# Patient Record
Sex: Male | Born: 1990 | Race: Black or African American | Hispanic: No | Marital: Single | Smoking: Former smoker
Health system: Southern US, Community
[De-identification: ages and names within clinical notes are randomized; demographics above are authoritative.]

## PROBLEM LIST (undated history)

## (undated) DIAGNOSIS — G8929 Other chronic pain: Secondary | ICD-10-CM

---

## 2008-02-28 ENCOUNTER — Emergency Department: Payer: Self-pay | Admitting: Emergency Medicine

## 2010-04-16 ENCOUNTER — Emergency Department: Payer: Self-pay | Admitting: Emergency Medicine

## 2013-02-12 ENCOUNTER — Emergency Department: Payer: Self-pay | Admitting: Emergency Medicine

## 2013-02-12 LAB — COMPREHENSIVE METABOLIC PANEL
Albumin: 4.6 g/dL (ref 3.4–5.0)
Alkaline Phosphatase: 73 U/L (ref 50–136)
Anion Gap: 4 — ABNORMAL LOW (ref 7–16)
BUN: 9 mg/dL (ref 7–18)
Bilirubin,Total: 0.9 mg/dL (ref 0.2–1.0)
Calcium, Total: 9.2 mg/dL (ref 8.5–10.1)
Co2: 27 mmol/L (ref 21–32)
Creatinine: 0.91 mg/dL (ref 0.60–1.30)
EGFR (Non-African Amer.): >60
Potassium: 3.7 mmol/L (ref 3.5–5.1)
SGPT (ALT): 16 U/L (ref 12–78)
Sodium: 137 mmol/L (ref 136–145)

## 2013-02-12 LAB — URINALYSIS, COMPLETE
Bilirubin,UR: NEGATIVE
Blood: NEGATIVE
Glucose,UR: NEGATIVE mg/dL (ref 0–75)
Leukocyte Esterase: NEGATIVE
Nitrite: NEGATIVE
Protein: NEGATIVE
RBC,UR: 1 /HPF (ref 0–5)
Specific Gravity: 1.017 (ref 1.003–1.030)
Squamous Epithelial: NONE SEEN
WBC UR: <1 /HPF (ref 0–5)

## 2013-02-12 LAB — CBC
HCT: 49.1 % (ref 40.0–52.0)
HGB: 15.8 g/dL (ref 13.0–18.0)
MCH: 28.7 pg (ref 26.0–34.0)
MCHC: 32.1 g/dL (ref 32.0–36.0)
MCV: 89 fL (ref 80–100)
Platelet: 252 10*3/uL (ref 150–440)
RBC: 5.5 10*6/uL (ref 4.40–5.90)
RDW: 13.4 % (ref 11.5–14.5)
WBC: 6.5 10*3/uL (ref 3.8–10.6)

## 2013-02-12 LAB — LIPASE, BLOOD: Lipase: 48 U/L — ABNORMAL LOW (ref 73–393)

## 2014-07-23 ENCOUNTER — Emergency Department: Payer: Self-pay | Admitting: Emergency Medicine

## 2014-08-20 ENCOUNTER — Emergency Department: Payer: Self-pay | Admitting: Emergency Medicine

## 2015-04-22 ENCOUNTER — Emergency Department
Admission: EM | Admit: 2015-04-22 | Discharge: 2015-04-22 | Disposition: A | Discharge status: Home / Self Care | Payer: Self-pay | Attending: Emergency Medicine | Admitting: Emergency Medicine

## 2015-04-22 ENCOUNTER — Encounter: Payer: Self-pay | Admitting: Emergency Medicine

## 2015-04-22 DIAGNOSIS — M791 Myalgia: Secondary | ICD-10-CM | POA: Insufficient documentation

## 2015-04-22 DIAGNOSIS — J029 Acute pharyngitis, unspecified: Secondary | ICD-10-CM | POA: Insufficient documentation

## 2015-04-22 LAB — POCT RAPID STREP A: Streptococcus, Group A Screen (Direct): NEGATIVE

## 2015-04-22 MED ORDER — LIDOCAINE VISCOUS 2 % MT SOLN
15.0000 mL | Freq: Once | OROMUCOSAL | Status: AC
  Administered 2015-04-22: 15 mL via OROMUCOSAL

## 2015-04-22 MED ORDER — LIDOCAINE VISCOUS 2 % MT SOLN: 20.0000 mL | OROMUCOSAL | Status: DC | PRN

## 2015-04-22 MED ORDER — LIDOCAINE VISCOUS 2 % MT SOLN
OROMUCOSAL | Status: AC
  Filled 2015-04-22: qty 15

## 2015-04-22 MED ORDER — AMOXICILLIN 500 MG PO CAPS: 500.0000 mg | ORAL_CAPSULE | Freq: Three times a day (TID) | ORAL | Status: AC

## 2015-04-22 NOTE — ED Notes (Signed)
Sore throat, chills for 2 days.

## 2015-04-22 NOTE — ED Provider Notes (Signed)
CSN: [id number]     Arrival date & time 04/22/15  1154 History   First MD Initiated Contact with Patient 04/22/15 1226     Chief Complaint  Patient presents with  . Sore Throat      HPI Comments: 24 year old male presents today complaining of sore throat for the past 2 days. He reports he has had fever and chills at home. He has not had any sinus congestion or cough. He has been using over-the-counter medicines without relief.  Patient is a 24 y.o. male presenting with pharyngitis. The history is provided by the patient.  Sore Throat This is a new problem. Episode onset:  2 days  The problem occurs constantly. The problem has been unchanged. Associated symptoms include chills, a fever, myalgias and a sore throat. Pertinent negatives include no neck pain or rash. The symptoms are aggravated by eating and drinking. He has tried acetaminophen for the symptoms. The treatment provided no relief.    History reviewed. No pertinent past medical history. History reviewed. No pertinent past surgical history. No family history on file. History  Substance Use Topics  . Smoking status: Not on file  . Smokeless tobacco: Not on file  . Alcohol Use: Not on file    Review of Systems  Constitutional: Positive for fever and chills.  HENT: Positive for sore throat.   Musculoskeletal: Positive for myalgias. Negative for neck pain.  Skin: Negative for rash.  All other systems reviewed and are negative.     Allergies  Review of patient's allergies indicates no known allergies.  Home Medications   Prior to Admission medications   Medication Sig Start Date End Date Taking? Authorizing Provider  amoxicillin (AMOXIL) 500 MG capsule Take 1 capsule (500 mg total) by mouth 3 (three) times daily. 04/22/15 05/02/15  Wilber Oliphant V, PA-C  lidocaine (XYLOCAINE) 2 % solution Use as directed 20 mLs in the mouth or throat as needed for mouth pain. 04/22/15   Wilber Oliphant V, PA-C   BP 118/79 mmHg  Pulse 72   Temp(Src) 100 F (37.8 C) (Oral)  Resp 18  Ht  (1.702 m)  Wt 140 lb (63.504 kg)  BMI 21.92 kg/m2  SpO2 98% Physical Exam  Constitutional: He is oriented to person, place, and time. He appears well-developed and well-nourished.  Non-toxic appearance. He does not have a sickly appearance. He appears ill.  Patient is febrile  HENT:  Head: Normocephalic and atraumatic.  Right Ear: External ear normal.  Left Ear: External ear normal.  Nose: Nose normal.  Mouth/Throat: Uvula is midline and mucous membranes are normal. Posterior oropharyngeal edema and posterior oropharyngeal erythema present. No oropharyngeal exudate.  Bilateral cerumen impaction  Eyes: Conjunctivae are normal.  Neck: Normal range of motion. Neck supple.  Cardiovascular: Normal rate, regular rhythm and normal heart sounds.  Exam reveals no gallop and no friction rub.   No murmur heard. Pulmonary/Chest: Effort normal. He has wheezes.  Musculoskeletal: Normal range of motion.  Lymphadenopathy:    He has cervical adenopathy.  Neurological: He is alert and oriented to person, place, and time.  Skin: Skin is warm and dry.  Psychiatric: He has a normal mood and affect. His behavior is normal. Judgment and thought content normal.  Nursing note and vitals reviewed.   ED Course  Procedures (including critical care time) Labs Review Labs Reviewed  CULTURE, BETA STREP (GROUP B ONLY)  CULTURE, GROUP A STREP ([facility] ONLY)  POCT RAPID STREP A    Imaging Review  No results found.   EKG Interpretation None      MDM  Amoxicillin course. Viscous lidocaine home. Alternate Tylenol and Motrin as needed. Return for new or worsening symptoms. Final diagnoses:  Pharyngitis        Luvenia Reddenmma Weavil V, PA-C 04/22/15 1409  Minna AntisKevin Paduchowski, MD 04/22/15 1440

## 2015-04-25 LAB — CULTURE, GROUP A STREP (THRC)

## 2016-11-06 ENCOUNTER — Encounter: Payer: Self-pay | Admitting: *Deleted

## 2016-11-06 ENCOUNTER — Emergency Department
Admission: EM | Admit: 2016-11-06 | Discharge: 2016-11-07 | Disposition: A | Discharge status: Home / Self Care | Payer: Self-pay | Attending: Emergency Medicine | Admitting: Emergency Medicine

## 2016-11-06 DIAGNOSIS — J111 Influenza due to unidentified influenza virus with other respiratory manifestations: Secondary | ICD-10-CM

## 2016-11-06 DIAGNOSIS — B349 Viral infection, unspecified: Secondary | ICD-10-CM | POA: Insufficient documentation

## 2016-11-06 DIAGNOSIS — R69 Illness, unspecified: Secondary | ICD-10-CM

## 2016-11-06 DIAGNOSIS — Z79899 Other long term (current) drug therapy: Secondary | ICD-10-CM | POA: Insufficient documentation

## 2016-11-06 DIAGNOSIS — F17200 Nicotine dependence, unspecified, uncomplicated: Secondary | ICD-10-CM | POA: Insufficient documentation

## 2016-11-06 MED ORDER — PSEUDOEPH-BROMPHEN-DM 30-2-10 MG/5ML PO SYRP: 5.0000 mL | ORAL_SOLUTION | Freq: Four times a day (QID) | ORAL | 0 refills | Status: DC | PRN

## 2016-11-06 MED ORDER — IBUPROFEN 600 MG PO TABS: 600.0000 mg | ORAL_TABLET | Freq: Three times a day (TID) | ORAL | 0 refills | Status: DC | PRN

## 2016-11-06 MED ORDER — KETOROLAC TROMETHAMINE 60 MG/2ML IM SOLN
60.0000 mg | Freq: Once | INTRAMUSCULAR | Status: AC
  Administered 2016-11-06: 60 mg via INTRAMUSCULAR
  Filled 2016-11-06: qty 2

## 2016-11-06 MED ORDER — BENZONATATE 100 MG PO CAPS
200.0000 mg | ORAL_CAPSULE | Freq: Once | ORAL | Status: AC
  Administered 2016-11-06: 200 mg via ORAL
  Filled 2016-11-06: qty 2

## 2016-11-06 NOTE — ED Triage Notes (Signed)
Patient c/o general body aches and chlls for 2-3 days.

## 2016-11-06 NOTE — ED Provider Notes (Signed)
[facility] Emergency Department Provider Note   ____________________________________________   First MD Initiated Contact with Patient 11/06/16 2137     (approximate)  I have reviewed the triage vital signs and the nursing notes.   HISTORY  Chief Complaint Generalized Body Aches    HPI Jearld ShinesMarquis K Mcqueary is a 25 y.o. male patient complain body aches and chills for 2-3 days. Patient also complained of frontal headache, sore throat, and nonproductive cough. Patient denies vomiting or diarrhea but states has nausea. Patient rates his pain as 8/10.   History reviewed. No pertinent past medical history.  There are no active problems to display for this patient.   History reviewed. No pertinent surgical history.  Prior to Admission medications   Medication Sig Start Date End Date Taking? Authorizing Provider  brompheniramine-pseudoephedrine-DM 30-2-10 MG/5ML syrup Take 5 mLs by mouth 4 (four) times daily as needed. 11/06/16   Joni Reiningonald K Aysen Shieh, PA-C  ibuprofen (ADVIL,MOTRIN) 600 MG tablet Take 1 tablet (600 mg total) by mouth every 8 (eight) hours as needed. 11/06/16   Joni Reiningonald K Baylei Siebels, PA-C  lidocaine (XYLOCAINE) 2 % solution Use as directed 20 mLs in the mouth or throat as needed for mouth pain. 04/22/15   Christella ScheuermannEmma V Lawrence, PA-C    Allergies Patient has no known allergies.  No family history on file.  Social History Social History  Substance Use Topics  . Smoking status: Current Every Day Smoker  . Smokeless tobacco: Never Used  . Alcohol use No    Review of Systems Constitutional: Fever,body ache, and chills Eyes: No visual changes. ENT: No sore throat. Cardiovascular: Denies chest pain. Respiratory: Denies shortness of breath. Gastrointestinal: No abdominal pain.  No nausea, no vomiting.  No diarrhea.  No constipation. Genitourinary: Negative for dysuria. Musculoskeletal: Negative for back pain. Skin: Negative for rash. Neurological: Positive  for headaches, denies focal weakness or numbness.    ____________________________________________   PHYSICAL EXAM:  VITAL SIGNS: ED Triage Vitals  Enc Vitals Group     BP 11/06/16 2056 133/77     Pulse Rate 11/06/16 2056 98     Resp 11/06/16 2056 18     Temp 11/06/16 2056 99.6 F (37.6 C)     Temp Source 11/06/16 2056 Oral     SpO2 11/06/16 2056 98 %     Weight 11/06/16 2056 140 lb (63.5 kg)     Height 11/06/16 2056 5\' 7"  (1.702 m)     Head Circumference --      Peak Flow --      Pain Score 11/06/16 2057 8     Pain Loc --      Pain Edu? --      Excl. in GC? --     Constitutional: Alert and oriented. Well appearing and in no acute distress. Eyes: Conjunctivae are normal. PERRL. EOMI. Head: Atraumatic. Nose: Edematous nasal turbinates with clear rhinorrhea  Mouth/Throat: Mucous membranes are moist.  Oropharynx non-erythematous. Neck: No stridor.  No cervical spine tenderness to palpation. Hematological/Lymphatic/Immunilogical: No cervical lymphadenopathy. Cardiovascular: Normal rate, regular rhythm. Grossly normal heart sounds.  Good peripheral circulation. Respiratory: Normal respiratory effort.  No retractions. Lungs CTAB. Gastrointestinal: Soft and nontender. No distention. No abdominal bruits. No CVA tenderness. Musculoskeletal: No lower extremity tenderness nor edema.  No joint effusions. Neurologic:  Normal speech and language. No gross focal neurologic deficits are appreciated. No gait instability. Skin:  Skin is warm, dry and intact. No rash noted. Psychiatric: Mood and affect are normal.  Speech and behavior are normal.  ____________________________________________   LABS (all labs ordered are listed, but only abnormal results are displayed)  Labs Reviewed  RESPIRATORY PANEL BY PCR  INFLUENZA PANEL BY PCR (TYPE A & B, H1N1)    ____________________________________________  EKG   ____________________________________________  RADIOLOGY   ____________________________________________   PROCEDURES  Procedure(s) performed: None  Procedures  Critical Care performed: No  ____________________________________________   INITIAL IMPRESSION / ASSESSMENT AND PLAN / ED COURSE  Pertinent labs & imaging results that were available during my care of the patient were reviewed by me and considered in my medical decision making (see chart for details).  Viral illness. Patient given discharge care instructions. Patient get a prescription from [facility] and ibuprofen. Patient advised follow "clinic if condition persists.  Clinical Course      ____________________________________________   FINAL CLINICAL IMPRESSION(S) / ED DIAGNOSES  Final diagnoses:  Influenza-like illness      NEW MEDICATIONS STARTED DURING THIS VISIT:  New Prescriptions   BROMPHENIRAMINE-PSEUDOEPHEDRINE-DM 30-2-10 MG/5ML SYRUP    Take 5 mLs by mouth 4 (four) times daily as needed.   IBUPROFEN (ADVIL,MOTRIN) 600 MG TABLET    Take 1 tablet (600 mg total) by mouth every 8 (eight) hours as needed.     Note:  This document was prepared using Dragon voice recognition software and may include unintentional dictation errors.    Joni Reiningonald K Mace Weinberg, PA-C 11/06/16 2347    Sharman CheekPhillip Stafford, MD 11/07/16 [phone]

## 2016-11-07 LAB — RESPIRATORY PANEL BY PCR
ADENOVIRUS-RVPPCR: NOT DETECTED
Bordetella pertussis: NOT DETECTED
CORONAVIRUS 229E-RVPPCR: NOT DETECTED
CORONAVIRUS NL63-RVPPCR: NOT DETECTED
Chlamydophila pneumoniae: NOT DETECTED
Coronavirus HKU1: NOT DETECTED
Coronavirus OC43: NOT DETECTED
Influenza A H3: DETECTED — AB
Influenza B: NOT DETECTED
METAPNEUMOVIRUS-RVPPCR: NOT DETECTED
MYCOPLASMA PNEUMONIAE-RVPPCR: NOT DETECTED
PARAINFLUENZA VIRUS 4-RVPPCR: NOT DETECTED
Parainfluenza Virus 1: NOT DETECTED
Parainfluenza Virus 2: NOT DETECTED
Parainfluenza Virus 3: NOT DETECTED
RESPIRATORY SYNCYTIAL VIRUS-RVPPCR: NOT DETECTED
Rhinovirus / Enterovirus: NOT DETECTED

## 2016-11-09 ENCOUNTER — Telehealth: Payer: Self-pay | Admitting: Emergency Medicine

## 2016-11-09 NOTE — Telephone Encounter (Signed)
Called patient due to lwot to inquire about condition and follow up plans. Number is not working number.

## 2018-02-27 ENCOUNTER — Encounter: Payer: Self-pay | Admitting: Emergency Medicine

## 2018-02-27 ENCOUNTER — Other Ambulatory Visit: Payer: Self-pay

## 2018-02-27 ENCOUNTER — Emergency Department
Admission: EM | Admit: 2018-02-27 | Discharge: 2018-02-27 | Disposition: A | Discharge status: Home / Self Care | Payer: Self-pay | Attending: Emergency Medicine | Admitting: Emergency Medicine

## 2018-02-27 DIAGNOSIS — J111 Influenza due to unidentified influenza virus with other respiratory manifestations: Secondary | ICD-10-CM | POA: Insufficient documentation

## 2018-02-27 DIAGNOSIS — R69 Illness, unspecified: Secondary | ICD-10-CM

## 2018-02-27 DIAGNOSIS — F17210 Nicotine dependence, cigarettes, uncomplicated: Secondary | ICD-10-CM | POA: Insufficient documentation

## 2018-02-27 MED ORDER — PSEUDOEPH-BROMPHEN-DM 30-2-10 MG/5ML PO SYRP: 5.0000 mL | ORAL_SOLUTION | Freq: Four times a day (QID) | ORAL | 0 refills | Status: DC | PRN

## 2018-02-27 MED ORDER — KETOROLAC TROMETHAMINE 60 MG/2ML IM SOLN
60.0000 mg | Freq: Once | INTRAMUSCULAR | Status: AC
  Administered 2018-02-27: 60 mg via INTRAMUSCULAR
  Filled 2018-02-27: qty 2

## 2018-02-27 MED ORDER — IBUPROFEN 600 MG PO TABS: 600.0000 mg | ORAL_TABLET | Freq: Three times a day (TID) | ORAL | 0 refills | Status: DC | PRN

## 2018-02-27 NOTE — ED Provider Notes (Signed)
[facility] Emergency Department Provider Note   ____________________________________________   First MD Initiated Contact with Patient 02/27/18 1252     (approximate)  I have reviewed the triage vital signs and the nursing notes.   HISTORY  Chief Complaint Generalized Body Aches    HPI Raymond Hendricks is a 27 y.o. male patient complained of onset of generalized body aches, scratchy throat and nasal congestion which started yesterday.  Patient denies nausea, vomiting, diarrhea, patient had taken flu shot for this season.  Patient states took allergy pill but found no relief with the nasal congestion.  Patient rates his pain as a 10/10.  Patient described the pain is "aching".  History reviewed. No pertinent past medical history.  There are no active problems to display for this patient.   History reviewed. No pertinent surgical history.  Prior to Admission medications   Medication Sig Start Date End Date Taking? Authorizing Provider  brompheniramine-pseudoephedrine-DM 30-2-10 MG/5ML syrup Take 5 mLs by mouth 4 (four) times daily as needed. 11/06/16   Joni Reining, PA-C  brompheniramine-pseudoephedrine-DM 30-2-10 MG/5ML syrup Take 5 mLs by mouth 4 (four) times daily as needed. 02/27/18   Joni Reining, PA-C  ibuprofen (ADVIL,MOTRIN) 600 MG tablet Take 1 tablet (600 mg total) by mouth every 8 (eight) hours as needed. 11/06/16   Joni Reining, PA-C  ibuprofen (ADVIL,MOTRIN) 600 MG tablet Take 1 tablet (600 mg total) by mouth every 8 (eight) hours as needed. 02/27/18   Joni Reining, PA-C  lidocaine (XYLOCAINE) 2 % solution Use as directed 20 mLs in the mouth or throat as needed for mouth pain. 04/22/15   Christella Scheuermann, PA-C    Allergies Patient has no known allergies.  No family history on file.  Social History Social History   Tobacco Use  . Smoking status: Current Every Day Smoker    Packs/day: 0.25    Types: Cigarettes  . Smokeless  tobacco: Never Used  Substance Use Topics  . Alcohol use: No  . Drug use: Yes    Types: Marijuana    Comment: several times during the week    Review of Systems Constitutional: No fever/chills.  Body ache. Eyes: No visual changes. ENT: Nasal congestion and sore throat. Cardiovascular: Denies chest pain. Respiratory: Denies shortness of breath. Gastrointestinal: No abdominal pain.  No nausea, no vomiting.  No diarrhea.  No constipation. Genitourinary: Negative for dysuria. Musculoskeletal: Negative for back pain. Skin: Negative for rash. Neurological: Negative for headaches, focal weakness or numbness.   ____________________________________________   PHYSICAL EXAM:  VITAL SIGNS: ED Triage Vitals  Enc Vitals Group     BP 02/27/18 1236 135/85     Pulse Rate 02/27/18 1236 98     Resp 02/27/18 1236 18     Temp 02/27/18 1236 99.1 F (37.3 C)     Temp Source 02/27/18 1236 Oral     SpO2 02/27/18 1236 99 %     Weight 02/27/18 1234 140 lb (63.5 kg)     Height 02/27/18 1234 5\' 7"  (1.702 m)     Head Circumference --      Peak Flow --      Pain Score 02/27/18 1234 10     Pain Loc --      Pain Edu? --      Excl. in GC? --    Constitutional: Alert and oriented. Well appearing and in no acute distress. Nose: Edematous nasal turbinates with clear rhinorrhea.   Mouth/Throat:  Mucous membranes are moist.  Oropharynx non-erythematous. Neck: No stridor.  Hematological/Lymphatic/Immunilogical: No cervical lymphadenopathy. Cardiovascular: Normal rate, regular rhythm. Grossly normal heart sounds.  Good peripheral circulation. Respiratory: Normal respiratory effort.  No retractions. Lungs CTAB. Neurologic:  Normal speech and language. No gross focal neurologic deficits are appreciated. No gait instability. Skin:  Skin is warm, dry and intact. No rash noted. Psychiatric: Mood and affect are normal. Speech and behavior are normal.  ____________________________________________    LABS (all labs ordered are listed, but only abnormal results are displayed)  Labs Reviewed - No data to display ____________________________________________  EKG   ____________________________________________  RADIOLOGY  ED MD interpretation:    Official radiology report(s): No results found.  ____________________________________________   PROCEDURES  Procedure(s) performed: None  Procedures  Critical Care performed: No  ____________________________________________   INITIAL IMPRESSION / ASSESSMENT AND PLAN / ED COURSE  As part of my medical decision making, I reviewed the following data within the electronic MEDICAL RECORD NUMBER    Patient presents with generalized body aches, nasal congestion, and sore throat secondary to viral illness.  Patient given discharge care instruction and work note.  Patient advised take medication as directed.      ____________________________________________   FINAL CLINICAL IMPRESSION(S) / ED DIAGNOSES  Final diagnoses:  Influenza-like illness     ED Discharge Orders        Ordered    brompheniramine-pseudoephedrine-DM 30-2-10 MG/5ML syrup  4 times daily PRN     02/27/18 1302    ibuprofen (ADVIL,MOTRIN) 600 MG tablet  Every 8 hours PRN     02/27/18 1302       Note:  This document was prepared using Dragon voice recognition software and may include unintentional dictation errors.    Joni ReiningSmith, Arayah Krouse K, PA-C 02/27/18 1308    Schaevitz, Myra Rudeavid Matthew, MD 02/27/18 [phone]

## 2018-02-27 NOTE — ED Triage Notes (Signed)
Pt arrived via POV from home with reports generalized body aches, scratchy throat and nasal drainage.  Pt states sxs started yesterday while at work.  Pt is not taking any medication.  Pt states he thought it was allergies and took allergy pill but did not provide relief.

## 2018-02-27 NOTE — ED Notes (Signed)
Pt ambulatory to POV without difficulty. VSS. NAD. Discharge instructions, RX and follow up discussed. All questions answered.  

## 2018-12-17 ENCOUNTER — Emergency Department
Admission: EM | Admit: 2018-12-17 | Discharge: 2018-12-17 | Disposition: A | Discharge status: Home / Self Care | Payer: Self-pay | Attending: Student in an Organized Health Care Education/Training Program | Admitting: Student in an Organized Health Care Education/Training Program

## 2018-12-17 ENCOUNTER — Encounter: Payer: Self-pay | Admitting: Emergency Medicine

## 2018-12-17 ENCOUNTER — Other Ambulatory Visit: Payer: Self-pay

## 2018-12-17 ENCOUNTER — Emergency Department: Payer: Self-pay

## 2018-12-17 DIAGNOSIS — W230XXA Caught, crushed, jammed, or pinched between moving objects, initial encounter: Secondary | ICD-10-CM | POA: Insufficient documentation

## 2018-12-17 DIAGNOSIS — Y999 Unspecified external cause status: Secondary | ICD-10-CM | POA: Insufficient documentation

## 2018-12-17 DIAGNOSIS — F17210 Nicotine dependence, cigarettes, uncomplicated: Secondary | ICD-10-CM | POA: Insufficient documentation

## 2018-12-17 DIAGNOSIS — Y9389 Activity, other specified: Secondary | ICD-10-CM | POA: Insufficient documentation

## 2018-12-17 DIAGNOSIS — S63502A Unspecified sprain of left wrist, initial encounter: Secondary | ICD-10-CM | POA: Insufficient documentation

## 2018-12-17 DIAGNOSIS — Y929 Unspecified place or not applicable: Secondary | ICD-10-CM | POA: Insufficient documentation

## 2018-12-17 NOTE — Discharge Instructions (Addendum)
Follow-up with your regular doctor or Dr. Allena Katz if not better in 1 week.  Wear the wrist splint for several days.  Apply ice to the wrist.  Take over-the-counter Aleve.  Return if worsening.

## 2018-12-17 NOTE — ED Triage Notes (Signed)
C/O pain to left wrist.  States injured wrist yesterday.  States "he thinks while he was working"  States was holding a tool with left hand and hitting it with right.  STates vibrations hurt wrist.

## 2018-12-17 NOTE — ED Provider Notes (Signed)
[facility] Emergency Department Provider Note  ____________________________________________   First MD Initiated Contact with Patient 12/17/18 1945     (approximate)  I have reviewed the triage vital signs and the nursing notes.   HISTORY  Chief Complaint Wrist Injury    HPI DENNYS HAWKINS is a 28 y.o. male presents emergency department complaint of left wrist pain.  States he injured it yesterday.  He was slamming 2 pieces of metal together.  He denies having the arm caught in the metal just states that the left hand and kept hitting it with the right.  States the vibration kept hurting the wrist.  He denies any numbness or tingling.    History reviewed. No pertinent past medical history.  There are no active problems to display for this patient.   History reviewed. No pertinent surgical history.  Prior to Admission medications   Not on File    Allergies Patient has no known allergies.  No family history on file.  Social History Social History   Tobacco Use  . Smoking status: Current Every Day Smoker    Packs/day: 0.25    Types: Cigarettes  . Smokeless tobacco: Never Used  Substance Use Topics  . Alcohol use: No  . Drug use: Yes    Types: Marijuana    Comment: several times during the week    Review of Systems  Constitutional: No fever/chills Eyes: No visual changes. ENT: No sore throat. Respiratory: Denies cough Genitourinary: Negative for dysuria. Musculoskeletal: Negative for back pain.  Left wrist pain Skin: Negative for rash.    ____________________________________________   PHYSICAL EXAM:  VITAL SIGNS: ED Triage Vitals  Enc Vitals Group     BP 12/17/18 1846 117/71     Pulse Rate 12/17/18 1846 64     Resp 12/17/18 1846 20     Temp 12/17/18 1846 98.2 F (36.8 C)     Temp Source 12/17/18 1846 Oral     SpO2 12/17/18 1846 98 %     Weight 12/17/18 1847 148 lb (67.1 kg)     Height 12/17/18 1847 5\' 7"  (1.702 m)      Head Circumference --      Peak Flow --      Pain Score 12/17/18 1857 8     Pain Loc --      Pain Edu? --      Excl. in GC? --     Constitutional: Alert and oriented. Well appearing and in no acute distress. Eyes: Conjunctivae are normal.  Head: Atraumatic. Nose: No congestion/rhinnorhea. Mouth/Throat: Mucous membranes are moist.   Neck:  supple no lymphadenopathy noted Cardiovascular: Normal rate, regular rhythm. Respiratory: Normal respiratory effort.  No retractions, GU: deferred Musculoskeletal: FROM all extremities, warm and well perfused, the left wrist is tender to palpation, neurovascular is intact, full range of motion is noted. Neurologic:  Normal speech and language.  Skin:  Skin is warm, dry and intact. No rash noted. Psychiatric: Mood and affect are normal. Speech and behavior are normal.  ____________________________________________   LABS (all labs ordered are listed, but only abnormal results are displayed)  Labs Reviewed - No data to display ____________________________________________   ____________________________________________  RADIOLOGY  X-ray of the left wrist is negative  ____________________________________________   PROCEDURES  Procedure(s) performed: Velcro cock-up splint was applied by the tech  Procedures    ____________________________________________   INITIAL IMPRESSION / ASSESSMENT AND PLAN / ED COURSE  Pertinent labs & imaging results that were  available during my care of the patient were reviewed by me and considered in my medical decision making (see chart for details).   Patient is 28 year old male presents emergency department complaint of left wrist pain.  Physical exam shows the left wrist to be tender along the carpal bones and into the distal forearm.  X-ray of the left wrist is negative  X-ray results were explained to the patient.  He was placed in a Velcro cock-up splint.  He is to follow-up with  orthopedics if not better in 5 to 7 days.  Return emergency department worsening.  States he understands will comply.  Was discharged stable condition.  He was given a work note and instructed to take Aleve.     As part of my medical decision making, I reviewed the following data within the electronic MEDICAL RECORD NUMBER Nursing notes reviewed and incorporated, Old chart reviewed, Radiograph reviewed x-ray left wrist is negative, Notes from prior ED visits and [place] Controlled Substance Database  ____________________________________________   FINAL CLINICAL IMPRESSION(S) / ED DIAGNOSES  Final diagnoses:  Wrist sprain, left, initial encounter      NEW MEDICATIONS STARTED DURING THIS VISIT:  Current Discharge Medication List       Note:  This document was prepared using Dragon voice recognition software and may include unintentional dictation errors.    Faythe GheeFisher, Avari Gelles W, PA-C 12/17/18 2013    Willy Eddyobinson, Patrick, MD 12/17/18 [phone]

## 2019-04-15 ENCOUNTER — Other Ambulatory Visit: Payer: Self-pay

## 2019-04-15 ENCOUNTER — Encounter: Payer: Self-pay | Admitting: Emergency Medicine

## 2019-04-15 ENCOUNTER — Emergency Department
Admission: EM | Admit: 2019-04-15 | Discharge: 2019-04-15 | Disposition: A | Discharge status: Home / Self Care | Payer: Self-pay | Attending: Emergency Medicine | Admitting: Emergency Medicine

## 2019-04-15 ENCOUNTER — Emergency Department: Payer: Self-pay

## 2019-04-15 DIAGNOSIS — F17210 Nicotine dependence, cigarettes, uncomplicated: Secondary | ICD-10-CM | POA: Insufficient documentation

## 2019-04-15 DIAGNOSIS — R0602 Shortness of breath: Secondary | ICD-10-CM | POA: Insufficient documentation

## 2019-04-15 MED ORDER — ALBUTEROL SULFATE HFA 108 (90 BASE) MCG/ACT IN AERS: 2.0000 | INHALATION_SPRAY | Freq: Four times a day (QID) | RESPIRATORY_TRACT | 0 refills | Status: AC | PRN

## 2019-04-15 NOTE — ED Notes (Signed)
Patient AAOx4. Vitals stable. NAD. 

## 2019-04-15 NOTE — ED Notes (Signed)
Pt sleeping soundly. No acute distress noted.

## 2019-04-15 NOTE — ED Notes (Signed)
md with pt

## 2019-04-15 NOTE — ED Notes (Signed)
md notified of pt status. No covid swab at this time. EKG given to MD.

## 2019-04-15 NOTE — Discharge Instructions (Addendum)
Please seek medical attention for any high fevers, chest pain, shortness of breath, change in behavior, persistent vomiting, bloody stool or any other new or concerning symptoms.  

## 2019-04-15 NOTE — ED Notes (Signed)
Pt refused labwork to be drawn. MD aware.

## 2019-04-15 NOTE — ED Triage Notes (Signed)
Pt reports SOB x2 days. Pt denies cough, fever, runny nose. Pt admits to smoking "a lot" of marijuana daily.

## 2019-04-15 NOTE — ED Provider Notes (Signed)
[facility] Emergency Department Provider Note    ____________________________________________   I have reviewed the triage vital signs and the nursing notes.   HISTORY  Chief Complaint Shortness of Breath   History limited by: Not Limited   HPI Raymond ShinesMarquis K Winegardner is a 28 y.o. male who presents to the emergency department today because of concern for shortness of breath. He states it has been present for the past two days. He feels short of breath with exertion and talking. Feels like he cannot get a deep breath. Denies any associated chest pain or cough. Denies any fevers. Denies any history of any lung disease. Does smoke marijuana.   Records reviewed. Per medical record review patient has a history of ER visits for flu like illnesses.   History reviewed. No pertinent past medical history.  There are no active problems to display for this patient.   History reviewed. No pertinent surgical history.  Prior to Admission medications   Not on File    Allergies Patient has no known allergies.  History reviewed. No pertinent family history.  Social History Social History   Tobacco Use  . Smoking status: Current Every Day Smoker    Packs/day: 0.25    Types: Cigarettes  . Smokeless tobacco: Never Used  Substance Use Topics  . Alcohol use: No  . Drug use: Yes    Types: Marijuana    Comment: several times during the week    Review of Systems Constitutional: No fever/chills Eyes: No visual changes. ENT: No sore throat. Cardiovascular: Denies chest pain. Respiratory: Positive for shortness of breath. Gastrointestinal: No abdominal pain.  No nausea, no vomiting.  No diarrhea.   Genitourinary: Negative for dysuria. Musculoskeletal: Negative for back pain. Skin: Negative for rash. Neurological: Negative for headaches, focal weakness or numbness.  ____________________________________________   PHYSICAL EXAM:  VITAL SIGNS: ED Triage Vitals  [04/15/19 0019]  Enc Vitals Group     BP 130/87     Pulse Rate 76     Resp 18     Temp 98 F (36.7 C)     Temp Source Oral     SpO2 99 %     Weight 150 lb (68 kg)   Constitutional: Alert and oriented.  Eyes: Conjunctivae are normal.  ENT      Head: Normocephalic and atraumatic.      Nose: No congestion/rhinnorhea.      Mouth/Throat: Mucous membranes are moist.      Neck: No stridor. Hematological/Lymphatic/Immunilogical: No cervical lymphadenopathy. Cardiovascular: Normal rate, regular rhythm.  No murmurs, rubs, or gallops. Respiratory: Normal respiratory effort without tachypnea nor retractions. Breath sounds are clear and equal bilaterally. No wheezes/rales/rhonchi. Gastrointestinal: Soft and non tender. No rebound. No guarding.  Genitourinary: Deferred Musculoskeletal: Normal range of motion in all extremities. No lower extremity edema. Neurologic:  Normal speech and language. No gross focal neurologic deficits are appreciated.  Skin:  Skin is warm, dry and intact. No rash noted. Psychiatric: Mood and affect are normal. Speech and behavior are normal. Patient exhibits appropriate insight and judgment.  ____________________________________________    LABS (pertinent positives/negatives)  None  ____________________________________________   EKG  I, Phineas SemenGraydon Jarret Torre, attending physician, personally viewed and interpreted this EKG  EKG Time: 0021 Rate: 70 Rhythm: normal sinus rhythm Axis: normal Intervals: qtc 414 QRS: narrow ST changes: no st elevation Impression: normal ekg   ____________________________________________    RADIOLOGY  CXR No active disease  ____________________________________________   PROCEDURES  Procedures  ____________________________________________  INITIAL IMPRESSION / ASSESSMENT AND PLAN / ED COURSE  Pertinent labs & imaging results that were available during my care of the patient were reviewed by me and considered in  my medical decision making (see chart for details).   Patient presented to the emergency department today because of concern for shortness of breath. Ddx would include pneumonia, bronchitis, anemia amongst other etiologies. Doubt ACS or PE. CXR without concerning findings. Patient declined blood draw. Given history of smoking will trial albuterol inhaler.   ____________________________________________   FINAL CLINICAL IMPRESSION(S) / ED DIAGNOSES  Final diagnoses:  Shortness of breath     Note: This dictation was prepared with Dragon dictation. Any transcriptional errors that result from this process are unintentional     Phineas Semen, MD 04/15/19 [id number]

## 2019-08-20 ENCOUNTER — Encounter: Payer: Self-pay | Admitting: Emergency Medicine

## 2019-08-20 ENCOUNTER — Other Ambulatory Visit: Payer: Self-pay

## 2019-08-20 ENCOUNTER — Emergency Department
Admission: EM | Admit: 2019-08-20 | Discharge: 2019-08-20 | Disposition: A | Discharge status: Home / Self Care | Payer: Self-pay | Attending: Emergency Medicine | Admitting: Emergency Medicine

## 2019-08-20 DIAGNOSIS — R1110 Vomiting, unspecified: Secondary | ICD-10-CM | POA: Insufficient documentation

## 2019-08-20 DIAGNOSIS — Z5321 Procedure and treatment not carried out due to patient leaving prior to being seen by health care provider: Secondary | ICD-10-CM | POA: Insufficient documentation

## 2019-08-20 DIAGNOSIS — R070 Pain in throat: Secondary | ICD-10-CM | POA: Insufficient documentation

## 2019-08-20 DIAGNOSIS — J029 Acute pharyngitis, unspecified: Secondary | ICD-10-CM | POA: Insufficient documentation

## 2019-08-20 DIAGNOSIS — F17210 Nicotine dependence, cigarettes, uncomplicated: Secondary | ICD-10-CM | POA: Insufficient documentation

## 2019-08-20 DIAGNOSIS — F1210 Cannabis abuse, uncomplicated: Secondary | ICD-10-CM | POA: Insufficient documentation

## 2019-08-20 LAB — COMPREHENSIVE METABOLIC PANEL WITH GFR
ALT: 14 U/L (ref 0–44)
AST: 17 U/L (ref 15–41)
Albumin: 4.4 g/dL (ref 3.5–5.0)
Alkaline Phosphatase: 67 U/L (ref 38–126)
Anion gap: 13 (ref 5–15)
BUN: 17 mg/dL (ref 6–20)
CO2: 24 mmol/L (ref 22–32)
Calcium: 9.2 mg/dL (ref 8.9–10.3)
Chloride: 101 mmol/L (ref 98–111)
Creatinine, Ser: 0.97 mg/dL (ref 0.61–1.24)
GFR calc Af Amer: >60 mL/min
GFR calc non Af Amer: >60 mL/min
Glucose, Bld: 104 mg/dL — ABNORMAL HIGH (ref 70–99)
Potassium: 3.5 mmol/L (ref 3.5–5.1)
Sodium: 138 mmol/L (ref 135–145)
Total Bilirubin: 1.5 mg/dL — ABNORMAL HIGH (ref 0.3–1.2)
Total Protein: 8.2 g/dL — ABNORMAL HIGH (ref 6.5–8.1)

## 2019-08-20 LAB — CBC
HCT: 41.2 % (ref 39.0–52.0)
Hemoglobin: 13.8 g/dL (ref 13.0–17.0)
MCH: 28.9 pg (ref 26.0–34.0)
MCHC: 33.5 g/dL (ref 30.0–36.0)
MCV: 86.4 fL (ref 80.0–100.0)
Platelets: 248 10*3/uL (ref 150–400)
RBC: 4.77 MIL/uL (ref 4.22–5.81)
RDW: 12.9 % (ref 11.5–15.5)
WBC: 10.8 10*3/uL — ABNORMAL HIGH (ref 4.0–10.5)
nRBC: 0 % (ref 0.0–0.2)

## 2019-08-20 LAB — GROUP A STREP BY PCR: Group A Strep by PCR: NOT DETECTED

## 2019-08-20 MED ORDER — IBUPROFEN 800 MG PO TABS
800.0000 mg | ORAL_TABLET | Freq: Once | ORAL | Status: AC
  Administered 2019-08-20: 800 mg via ORAL
  Filled 2019-08-20: qty 1

## 2019-08-20 MED ORDER — IBUPROFEN 600 MG PO TABS: 600.0000 mg | ORAL_TABLET | Freq: Four times a day (QID) | ORAL | 0 refills | Status: AC | PRN

## 2019-08-20 NOTE — ED Notes (Signed)
No answer when called x 3.  

## 2019-08-20 NOTE — ED Triage Notes (Signed)
Patient to ER for c/o sore throat after vomiting due to ETOH this weekend. Patient states he ate fried bologna sandwich, which made throat burn. Patient has some spitting in triage, but other times is able to swallow saliva. Patient able to speak, though speaking is painful and voice sounds very slightly muffled.

## 2019-08-20 NOTE — ED Provider Notes (Addendum)
[facility] EMERGENCY DEPARTMENT Provider Note   CSN: [id number] Arrival date & time: 08/20/19  1849     History   Chief Complaint Chief Complaint  Patient presents with  . Sore Throat    HPI Raymond Hendricks is a 28 y.o. male presents the emergency department for evaluation of sore throat.  Patient states Sunday and Monday he was vomiting due to becoming intoxicated Saturday evening.  Patient denies any vomiting of blood, chest pain or shortness of breath.  Patient states this Monday he has had a sore throat, is hard time to swallow food.  He denies any nausea, vomiting, fevers, chills, body aches, abdominal pain.  Patient has taken Tylenol and ibuprofen with improvement of his sore throat, patient states he was taking children's liquid ibuprofen and Tylenol, last dose which was this morning of Tylenol.  He is able to tolerate p.o. well.     HPI  History reviewed. No pertinent past medical history.  There are no active problems to display for this patient.   History reviewed. No pertinent surgical history.      Home Medications    Prior to Admission medications   Medication Sig Start Date End Date Taking? Authorizing Provider  albuterol (VENTOLIN HFA) 108 (90 Base) MCG/ACT inhaler Inhale 2 puffs into the lungs every 6 (six) hours as needed for wheezing or shortness of breath. 04/15/19   Phineas Semen, MD  ibuprofen (ADVIL) 600 MG tablet Take 1 tablet (600 mg total) by mouth every 6 (six) hours as needed for moderate pain. 08/20/19   Evon Slack, PA-C    Family History No family history on file.  Social History Social History   Tobacco Use  . Smoking status: Current Every Day Smoker    Packs/day: 0.25    Types: Cigarettes  . Smokeless tobacco: Never Used  Substance Use Topics  . Alcohol use: No  . Drug use: Yes    Types: Marijuana    Comment: several times during the week     Allergies   Patient has no known allergies.   Review  of Systems Review of Systems  Constitutional: Negative.  Negative for activity change, appetite change, chills and fever.  HENT: Positive for sore throat. Negative for congestion, ear pain, mouth sores, rhinorrhea, sinus pressure, trouble swallowing and voice change.   Eyes: Negative for photophobia, pain and discharge.  Respiratory: Negative for cough, chest tightness and shortness of breath.   Cardiovascular: Negative for chest pain and leg swelling.  Gastrointestinal: Negative for abdominal distention, abdominal pain, blood in stool, diarrhea, nausea and vomiting.  Musculoskeletal: Negative for arthralgias, back pain and gait problem.  Skin: Negative for color change and rash.  Neurological: Negative for dizziness and headaches.  Hematological: Negative for adenopathy.  Psychiatric/Behavioral: Negative for agitation and behavioral problems.     Physical Exam Updated Vital Signs BP 129/80   Pulse 87   Temp 98.8 F (37.1 C) (Oral)   Resp 16   Ht 5\' 6"  (1.676 m)   Wt 70.3 kg   SpO2 97%   BMI 25.02 kg/m   Physical Exam Constitutional:      Appearance: He is well-developed.  HENT:     Head: Normocephalic and atraumatic.     Right Ear: Ear canal normal.     Left Ear: Ear canal normal.     Nose: No congestion or rhinorrhea.     Mouth/Throat:     Mouth: No oral lesions.  Pharynx: Uvula midline. No pharyngeal swelling, oropharyngeal exudate, posterior oropharyngeal erythema or uvula swelling.     Comments: Mild pharyngeal erythema with no exudates.  No tonsillar swelling.  Uvula is midline.  No blood. Eyes:     Conjunctiva/sclera: Conjunctivae normal.  Neck:     Musculoskeletal: Normal range of motion.  Cardiovascular:     Rate and Rhythm: Normal rate.  Pulmonary:     Effort: Pulmonary effort is normal. No respiratory distress.  Musculoskeletal: Normal range of motion.  Skin:    General: Skin is warm.     Findings: No rash.  Neurological:     Mental Status: He is  alert and oriented to person, place, and time.  Psychiatric:        Behavior: Behavior normal.        Thought Content: Thought content normal.      ED Treatments / Results  Labs (all labs ordered are listed, but only abnormal results are displayed) Labs Reviewed  GROUP A STREP BY PCR    EKG None  Radiology No results found.  Procedures Procedures (including critical care time)  Medications Ordered in ED Medications  ibuprofen (ADVIL) tablet 800 mg (800 mg Oral Given 08/20/19 1929)     Initial Impression / Assessment and Plan / ED Course  I have reviewed the triage vital signs and the nursing notes.  Pertinent labs & imaging results that were available during my care of the patient were reviewed by me and considered in my medical decision making (see chart for details).        28 year old male with sore throat from vomiting a few days ago.  Denies any other symptoms except for sore throat.  Noticed relief yesterday with children's liquid ibuprofen, was given 100 ibuprofen here in the ED and saw significant improvement.  A strep test was negative.  Vital signs are stable, afebrile.  No headaches, body aches, chills.  Pharyngeal exam is normal.  Patient reports no history of blood in his bowel, vomit.  Patient appears well, vital signs are stable, asymptomatic after ibuprofen.  He is tolerating p.o. well.  He is educated on signs and symptoms return to ED for.  Final Clinical Impressions(s) / ED Diagnoses   Final diagnoses:  Pharyngitis, unspecified etiology  Sore throat    ED Discharge Orders         Ordered    ibuprofen (ADVIL) 600 MG tablet  Every 6 hours PRN     08/20/19 2036           Duanne Guess, PA-C 08/20/19 2039    Duanne Guess, PA-C 08/20/19 2039    Delman Kitten, MD 08/20/19 2355

## 2019-08-20 NOTE — ED Triage Notes (Signed)
PT c/o sore throat xfew days. Pt states he was vomiting xfew days ago from drinking and thought it was sore from emesis. Denies any abd pain or vomiting now. Denies fever or cough

## 2019-08-20 NOTE — Discharge Instructions (Addendum)
Please gargle with warm salt water and take ibuprofen as prescribed.  You may also take Tylenol every 6 hours as needed.  If any difficulty swallowing, increasing pain, fevers headaches abdominal pain vomiting, blood in stool, return to the ER.

## 2019-12-19 ENCOUNTER — Emergency Department
Admission: EM | Admit: 2019-12-19 | Discharge: 2019-12-19 | Disposition: A | Discharge status: Home / Self Care | Payer: Self-pay | Attending: Emergency Medicine | Admitting: Emergency Medicine

## 2019-12-19 ENCOUNTER — Encounter: Payer: Self-pay | Admitting: Emergency Medicine

## 2019-12-19 ENCOUNTER — Other Ambulatory Visit: Payer: Self-pay

## 2019-12-19 DIAGNOSIS — R519 Headache, unspecified: Secondary | ICD-10-CM | POA: Insufficient documentation

## 2019-12-19 DIAGNOSIS — Z79899 Other long term (current) drug therapy: Secondary | ICD-10-CM | POA: Insufficient documentation

## 2019-12-19 DIAGNOSIS — F17210 Nicotine dependence, cigarettes, uncomplicated: Secondary | ICD-10-CM | POA: Insufficient documentation

## 2019-12-19 DIAGNOSIS — Z20822 Contact with and (suspected) exposure to covid-19: Secondary | ICD-10-CM | POA: Insufficient documentation

## 2019-12-19 LAB — CBC WITH DIFFERENTIAL/PLATELET
Abs Immature Granulocytes: 0.05 10*3/uL (ref 0.00–0.07)
Basophils Absolute: 0 10*3/uL (ref 0.0–0.1)
Basophils Relative: 0 %
Eosinophils Absolute: 0 10*3/uL (ref 0.0–0.5)
Eosinophils Relative: 0 %
HCT: 43.5 % (ref 39.0–52.0)
Hemoglobin: 14 g/dL (ref 13.0–17.0)
Immature Granulocytes: 0 %
Lymphocytes Relative: 21 %
Lymphs Abs: 2.7 10*3/uL (ref 0.7–4.0)
MCH: 28.1 pg (ref 26.0–34.0)
MCHC: 32.2 g/dL (ref 30.0–36.0)
MCV: 87.3 fL (ref 80.0–100.0)
Monocytes Absolute: 1.4 10*3/uL — ABNORMAL HIGH (ref 0.1–1.0)
Monocytes Relative: 11 %
Neutro Abs: 8.6 10*3/uL — ABNORMAL HIGH (ref 1.7–7.7)
Neutrophils Relative %: 68 %
Platelets: 257 10*3/uL (ref 150–400)
RBC: 4.98 MIL/uL (ref 4.22–5.81)
RDW: 13 % (ref 11.5–15.5)
WBC: 12.7 10*3/uL — ABNORMAL HIGH (ref 4.0–10.5)
nRBC: 0 % (ref 0.0–0.2)

## 2019-12-19 LAB — BASIC METABOLIC PANEL
Anion gap: 10 (ref 5–15)
BUN: 10 mg/dL (ref 6–20)
CO2: 22 mmol/L (ref 22–32)
Calcium: 9.1 mg/dL (ref 8.9–10.3)
Chloride: 104 mmol/L (ref 98–111)
Creatinine, Ser: 0.99 mg/dL (ref 0.61–1.24)
GFR calc Af Amer: >60 mL/min (ref >60)
GFR calc non Af Amer: >60 mL/min (ref >60)
Glucose, Bld: 107 mg/dL — ABNORMAL HIGH (ref 70–99)
Potassium: 3.6 mmol/L (ref 3.5–5.1)
Sodium: 136 mmol/L (ref 135–145)

## 2019-12-19 LAB — SARS CORONAVIRUS 2 (TAT 6-24 HRS): SARS Coronavirus 2: NEGATIVE

## 2019-12-19 MED ORDER — MORPHINE SULFATE (PF) 4 MG/ML IV SOLN
4.0000 mg | Freq: Once | INTRAVENOUS | Status: AC
  Administered 2019-12-19: 4 mg via INTRAVENOUS
  Filled 2019-12-19: qty 1

## 2019-12-19 MED ORDER — ONDANSETRON HCL 4 MG/2ML IJ SOLN
4.0000 mg | Freq: Once | INTRAMUSCULAR | Status: AC
  Administered 2019-12-19: 13:00:00 4 mg via INTRAVENOUS
  Filled 2019-12-19: qty 2

## 2019-12-19 MED ORDER — SODIUM CHLORIDE 0.9 % IV BOLUS
1000.0000 mL | Freq: Once | INTRAVENOUS | Status: AC
  Administered 2019-12-19: 13:00:00 1000 mL via INTRAVENOUS

## 2019-12-19 NOTE — ED Provider Notes (Signed)
[facility] Emergency Department Provider Note  ____________________________________________   First MD Initiated Contact with Patient 12/19/19 1221     (approximate)  I have reviewed the triage vital signs and the nursing notes.   HISTORY  Chief Complaint Headache    HPI ABDI HUSAK is a 29 y.o. male [facility] emergency department complaining of a headache.  He denies any fever or chills.  States of a headache for 2 days.  No nausea or vomiting.  No sensitivity to light.  No sensitivity to noise.  Unsure if he has been exposed to Covid.  He took 1 dose of Advil without any relief.    History reviewed. No pertinent past medical history.  There are no problems to display for this patient.   History reviewed. No pertinent surgical history.  Prior to Admission medications   Medication Sig Start Date End Date Taking? Authorizing Provider  albuterol (VENTOLIN HFA) 108 (90 Base) MCG/ACT inhaler Inhale 2 puffs into the lungs every 6 (six) hours as needed for wheezing or shortness of breath. 04/15/19   Nance Pear, MD  ibuprofen (ADVIL) 600 MG tablet Take 1 tablet (600 mg total) by mouth every 6 (six) hours as needed for moderate pain. 08/20/19   Duanne Guess, PA-C    Allergies Patient has no known allergies.  No family history on file.  Social History Social History   Tobacco Use  . Smoking status: Current Every Day Smoker    Packs/day: 0.25    Types: Cigarettes  . Smokeless tobacco: Never Used  Substance Use Topics  . Alcohol use: No  . Drug use: Yes    Types: Marijuana    Comment: several times during the week    Review of Systems  Constitutional: No fever/chills, positive headache Eyes: No visual changes. ENT: No sore throat. Respiratory: Denies cough Cardiovascular: Denies chest pain Gastrointestinal: Denies abdominal pain Genitourinary: Negative for dysuria. Musculoskeletal: Negative for back pain. Skin: Negative for  rash. Psychiatric: no mood changes,     ____________________________________________   PHYSICAL EXAM:  VITAL SIGNS: ED Triage Vitals  Enc Vitals Group     BP 12/19/19 1124 113/70     Pulse Rate 12/19/19 1124 90     Resp 12/19/19 1124 18     Temp 12/19/19 1124 99.6 F (37.6 C)     Temp Source 12/19/19 1124 Oral     SpO2 12/19/19 1124 96 %     Weight 12/19/19 1117 154 lb 15.7 oz (70.3 kg)     Height --      Head Circumference --      Peak Flow --      Pain Score 12/19/19 1116 10     Pain Loc --      Pain Edu? --      Excl. in [place]? --     Constitutional: Alert and oriented. Well appearing and in no acute distress. Eyes: Conjunctivae are normal.  Head: Atraumatic. Ears: TMs are clear Nose: No congestion/rhinnorhea. Mouth/Throat: Mucous membranes are moist.   Neck:  supple no lymphadenopathy noted Cardiovascular: Normal rate, regular rhythm. Heart sounds are normal Respiratory: Normal respiratory effort.  No retractions, lungs c t a  Abd: soft nontender bs normal all 4 quad  GU: deferred Musculoskeletal: FROM all extremities, warm and well perfused Neurologic:  Normal speech and language.  Skin:  Skin is warm, dry and intact. No rash noted. Psychiatric: Mood and affect are normal. Speech and behavior are normal.  ____________________________________________  LABS (all labs ordered are listed, but only abnormal results are displayed)  Labs Reviewed  BASIC METABOLIC PANEL - Abnormal; Notable for the following components:      Result Value   Glucose, Bld 107 (*)    All other components within normal limits  CBC WITH DIFFERENTIAL/PLATELET - Abnormal; Notable for the following components:   WBC 12.7 (*)    Neutro Abs 8.6 (*)    Monocytes Absolute 1.4 (*)    All other components within normal limits  SARS CORONAVIRUS 2 (TAT 6-24 HRS)    ____________________________________________   ____________________________________________  RADIOLOGY    ____________________________________________   PROCEDURES  Procedure(s) performed: Saline lock, normal saline 1 L IV, morphine 4 mg IV, Zofran 4 mg IV   Procedures    ____________________________________________   INITIAL IMPRESSION / ASSESSMENT AND PLAN / ED COURSE  Pertinent labs & imaging results that were available during my care of the patient were reviewed by me and considered in my medical decision making (see chart for details).   Patient is a 29 year old male presents emergency department complaint of headache.  See HPI  Physical exam patient appears well.  Exam is basically unremarkable.  CBC has basically a normal WBC of 12.7 even though it is a little elevated.  Metabolic panel is normal.  Explained findings to the patient.  After the medication of normal saline, morphine, and Zofran patient states his headache is gone.  I do not feel at this time we need to do a CT.  He is not have pain with movement of his neck.  He appears to be well.  Therefore he is  discharged in stable condition.  He was given strict instructions to return if worsening.  We did send a Covid test due to the headache, explained to him the results would be available in 6 to 24 hours.  He should continue to quarantine till he receives those results.  If negative he can go about his daily activities.  If positive he should remain quarantined for 10 days.  He states he understands will comply.  Is discharged stable condition.    CHASKA HAGGER was evaluated in Emergency Department on 12/19/2019 for the symptoms described in the history of present illness. He was evaluated in the context of the global COVID-19 pandemic, which necessitated consideration that the patient might be at risk for infection with the SARS-CoV-2 virus that causes COVID-19. Institutional protocols and algorithms that  pertain to the evaluation of patients at risk for COVID-19 are in a state of rapid change based on information released by regulatory bodies including the CDC and federal and state organizations. These policies and algorithms were followed during the patient's care in the ED.   As part of my medical decision making, I reviewed the following data within the electronic MEDICAL RECORD NUMBER Nursing notes reviewed and incorporated, Labs reviewed see above, Old chart reviewed, Notes from prior ED visits and [place] Controlled Substance Database  ____________________________________________   FINAL CLINICAL IMPRESSION(S) / ED DIAGNOSES  Final diagnoses:  Bad headache  Suspected COVID-19 virus infection      NEW MEDICATIONS STARTED DURING THIS VISIT:  Discharge Medication List as of 12/19/2019  1:45 PM       Note:  This document was prepared using Dragon voice recognition software and may include unintentional dictation errors.    Faythe Ghee, PA-C 12/19/19 1356    Chesley Noon, MD 12/19/19 1524

## 2019-12-19 NOTE — Discharge Instructions (Signed)
Follow-up with your regular doctor if not improving in 3 days.  Return emergency department worsening.  Your Covid test results should show up on my chart and 6 to 24 hours.  If he did not have my chart so we will call you with the result.  Quarantine until you receive this result.  If negative go back to daily business.  If positive you should quarantine for an additional 10 days.

## 2019-12-19 NOTE — ED Triage Notes (Signed)
Headache  x 2 days.  C/O pain to forehead.  AAOx3.  Skin warm and dry. NAD.  States took advil yesterday for pain, no relief.

## 2020-06-19 ENCOUNTER — Emergency Department
Admission: EM | Admit: 2020-06-19 | Discharge: 2020-06-19 | Disposition: A | Discharge status: Home / Self Care | Payer: Self-pay | Attending: Emergency Medicine | Admitting: Emergency Medicine

## 2020-06-19 ENCOUNTER — Other Ambulatory Visit: Payer: Self-pay

## 2020-06-19 ENCOUNTER — Encounter: Payer: Self-pay | Admitting: Emergency Medicine

## 2020-06-19 DIAGNOSIS — R519 Headache, unspecified: Secondary | ICD-10-CM | POA: Insufficient documentation

## 2020-06-19 DIAGNOSIS — Z87891 Personal history of nicotine dependence: Secondary | ICD-10-CM | POA: Insufficient documentation

## 2020-06-19 HISTORY — DX: Other chronic pain: G89.29

## 2020-06-19 LAB — CBC
HCT: 43.1 % (ref 39.0–52.0)
Hemoglobin: 14.2 g/dL (ref 13.0–17.0)
MCH: 29 pg (ref 26.0–34.0)
MCHC: 32.9 g/dL (ref 30.0–36.0)
MCV: 88 fL (ref 80.0–100.0)
Platelets: 257 10*3/uL (ref 150–400)
RBC: 4.9 MIL/uL (ref 4.22–5.81)
RDW: 13.3 % (ref 11.5–15.5)
WBC: 9.1 10*3/uL (ref 4.0–10.5)
nRBC: 0 % (ref 0.0–0.2)

## 2020-06-19 LAB — COMPREHENSIVE METABOLIC PANEL
ALT: 9 U/L (ref 0–44)
AST: 13 U/L — ABNORMAL LOW (ref 15–41)
Albumin: 4.4 g/dL (ref 3.5–5.0)
Alkaline Phosphatase: 68 U/L (ref 38–126)
Anion gap: 10 (ref 5–15)
BUN: 10 mg/dL (ref 6–20)
CO2: 26 mmol/L (ref 22–32)
Calcium: 9.6 mg/dL (ref 8.9–10.3)
Chloride: 103 mmol/L (ref 98–111)
Creatinine, Ser: 1.08 mg/dL (ref 0.61–1.24)
GFR calc Af Amer: >60 mL/min (ref >60)
GFR calc non Af Amer: >60 mL/min (ref >60)
Glucose, Bld: 105 mg/dL — ABNORMAL HIGH (ref 70–99)
Potassium: 3.7 mmol/L (ref 3.5–5.1)
Sodium: 139 mmol/L (ref 135–145)
Total Bilirubin: 1.3 mg/dL — ABNORMAL HIGH (ref 0.3–1.2)
Total Protein: 8.1 g/dL (ref 6.5–8.1)

## 2020-06-19 LAB — LIPASE, BLOOD: Lipase: 18 U/L (ref 11–51)

## 2020-06-19 MED ORDER — SODIUM CHLORIDE 0.9% FLUSH: 3.0000 mL | Freq: Once | INTRAVENOUS | Status: DC

## 2020-06-19 MED ORDER — PROCHLORPERAZINE EDISYLATE 10 MG/2ML IJ SOLN
10.0000 mg | Freq: Once | INTRAMUSCULAR | Status: DC
  Filled 2020-06-19: qty 2

## 2020-06-19 MED ORDER — BUTALBITAL-APAP-CAFFEINE 50-325-40 MG PO TABS
1.0000 | ORAL_TABLET | Freq: Once | ORAL | Status: AC
  Administered 2020-06-19: 1 via ORAL
  Filled 2020-06-19: qty 1

## 2020-06-19 MED ORDER — SODIUM CHLORIDE 0.9 % IV BOLUS: 1000.0000 mL | Freq: Once | INTRAVENOUS | Status: DC

## 2020-06-19 MED ORDER — BUTALBITAL-APAP-CAFFEINE 50-325-40 MG PO TABS: 1.0000 | ORAL_TABLET | Freq: Four times a day (QID) | ORAL | 0 refills | Status: AC | PRN

## 2020-06-19 NOTE — ED Notes (Signed)
Called for room x 1.  

## 2020-06-19 NOTE — Discharge Instructions (Addendum)
Please seek medical attention for any high fevers, chest pain, shortness of breath, change in behavior, persistent vomiting, bloody stool or any other new or concerning symptoms.  

## 2020-06-19 NOTE — ED Notes (Signed)
Pt presents to the ED for a headache for three days. Pt denies NVD and pt also states he has a fever. Pt did not take temperature at home. Denies light/noise sensitivity. V/S WNL and pt A&Ox4 and NAD.

## 2020-06-19 NOTE — ED Provider Notes (Signed)
[facility] Emergency Department Provider Note  ____________________________________________   I have reviewed the triage vital signs and the nursing notes.   HISTORY  Chief Complaint Headache, Emesis, and Dizziness   History limited by: Not Limited   HPI Raymond Hendricks is a 29 y.o. male who presents to the emergency department today because of concerns for headache.  Patient states he has had headache for 3 days.  He describes it as severe.  He has history of similar headaches in the past.  He has been using high-dose ibuprofen which has given some slight relief although he says he has not run out of it.  He denies any vision changes.  Denies any weakness in his arms or legs.  Denies any recent trauma to his head.  Denies any fevers.  Records reviewed. Per medical record review patient has a history of ER visits in the past for headache and vertigo. Had advanced imaging performed.   Past Medical History:  Diagnosis Date  . Chronic headaches     There are no problems to display for this patient.   History reviewed. No pertinent surgical history.  Prior to Admission medications   Medication Sig Start Date End Date Taking? Authorizing Provider  albuterol (VENTOLIN HFA) 108 (90 Base) MCG/ACT inhaler Inhale 2 puffs into the lungs every 6 (six) hours as needed for wheezing or shortness of breath. 04/15/19   Phineas Semen, MD  ibuprofen (ADVIL) 600 MG tablet Take 1 tablet (600 mg total) by mouth every 6 (six) hours as needed for moderate pain. 08/20/19   Evon Slack, PA-C    Allergies Patient has no known allergies.  No family history on file.  Social History Social History   Tobacco Use  . Smoking status: Former Smoker    Packs/day: 0.25    Types: Cigarettes  . Smokeless tobacco: Never Used  Substance Use Topics  . Alcohol use: No  . Drug use: Yes    Types: Marijuana    Comment: several times during the week    Review of  Systems Constitutional: No fever/chills Eyes: No visual changes. ENT: No sore throat. Cardiovascular: Denies chest pain. Respiratory: Denies shortness of breath. Gastrointestinal: No abdominal pain.  No nausea, no vomiting.  No diarrhea.   Genitourinary: Negative for dysuria. Musculoskeletal: Negative for back pain. Skin: Negative for rash. Neurological: Positive for headache. ____________________________________________   PHYSICAL EXAM:  VITAL SIGNS: ED Triage Vitals  Enc Vitals Group     BP 06/19/20 1214 106/72     Pulse Rate 06/19/20 1214 88     Resp 06/19/20 1214 16     Temp 06/19/20 1214 100 F (37.8 C)     Temp Source 06/19/20 1214 Oral     SpO2 06/19/20 1214 100 %     Weight 06/19/20 1212 160 lb (72.6 kg)     Height 06/19/20 1212 5\' 7"  (1.702 m)     Head Circumference --      Peak Flow --      Pain Score 06/19/20 1212 10   Constitutional: Alert and oriented.  Eyes: Conjunctivae are normal.  ENT      Head: Normocephalic and atraumatic.      Nose: No congestion/rhinnorhea.      Mouth/Throat: Mucous membranes are moist.      Neck: No stridor. Hematological/Lymphatic/Immunilogical: No cervical lymphadenopathy. Cardiovascular: Normal rate, regular rhythm.  No murmurs, rubs, or gallops.  Respiratory: Normal respiratory effort without tachypnea nor retractions. Breath sounds are clear  and equal bilaterally. No wheezes/rales/rhonchi. Gastrointestinal: Soft and non tender. No rebound. No guarding.  Genitourinary: Deferred Musculoskeletal: Normal range of motion in all extremities. No lower extremity edema. Neurologic:  Normal speech and language. No gross focal neurologic deficits are appreciated.  Skin:  Skin is warm, dry and intact. No rash noted. Psychiatric: Mood and affect are normal. Speech and behavior are normal. Patient exhibits appropriate insight and judgment.  ____________________________________________    LABS (pertinent positives/negatives)  Lipase  18 CBC wbc 9.1, hgb 14.2, plt 257 CMP wnl except glu 105, ast 13, t bili 1.3  ____________________________________________   EKG  None  ____________________________________________    RADIOLOGY  None  ____________________________________________   PROCEDURES  Procedures  ____________________________________________   INITIAL IMPRESSION / ASSESSMENT AND PLAN / ED COURSE  Pertinent labs & imaging results that were available during my care of the patient were reviewed by me and considered in my medical decision making (see chart for details).   Patient presented to the emergency department today because of concerns for bad headache.  Patient states he has a history of migraines.  Patient had recent neuroimaging a few months ago.  At this time do not feel any emergent neuroimaging is necessary.  Will plan on giving patient medication to try to help with headaches.   Patient did feel better after medication.  Will discharge.  Will give patient neurology follow-up information.   ____________________________________________   FINAL CLINICAL IMPRESSION(S) / ED DIAGNOSES  Final diagnoses:  Bad headache     Note: This dictation was prepared with Dragon dictation. Any transcriptional errors that result from this process are unintentional     Phineas Semen, MD 06/19/20 2241

## 2020-06-19 NOTE — ED Notes (Signed)
Pt refused IV placement, fluids and medications. Pt requested PO medication instead.   EDP aware, new orders placed.

## 2020-06-19 NOTE — ED Notes (Signed)
Pt in stretcher, with blanket over head c/o HA. EDP bedside.  Family bedside, NAD noted, VSS. Call bell within reach.

## 2020-06-19 NOTE — ED Notes (Signed)
Repeat VS obtained by this RN. Pt visualized in NAD at this time. Pt stating he is hurting sitting in wheelchair. This RN offered to move patient to bench. Pt refused stating he is good.

## 2020-06-19 NOTE — ED Triage Notes (Signed)
Pt to ED via POV c/o headache x 3 days. Pt states that he is also having dizziness and vomiting. Pt vomited x 1 about 1 hour ago. Pt is in NAD.

## 2020-06-25 ENCOUNTER — Other Ambulatory Visit: Payer: Self-pay | Admitting: Acute Care

## 2020-06-25 ENCOUNTER — Other Ambulatory Visit (HOSPITAL_COMMUNITY): Payer: Self-pay | Admitting: Acute Care

## 2020-06-25 DIAGNOSIS — R519 Headache, unspecified: Secondary | ICD-10-CM

## 2020-07-11 ENCOUNTER — Ambulatory Visit: Payer: Self-pay

## 2020-07-23 IMAGING — DX PORTABLE CHEST - 1 VIEW
1 series · 1 of 1 positions shown · non-contrast
Comparison: None.

CLINICAL DATA: Shortness of breath

EXAM:
PORTABLE CHEST 1 VIEW

[chest ap]
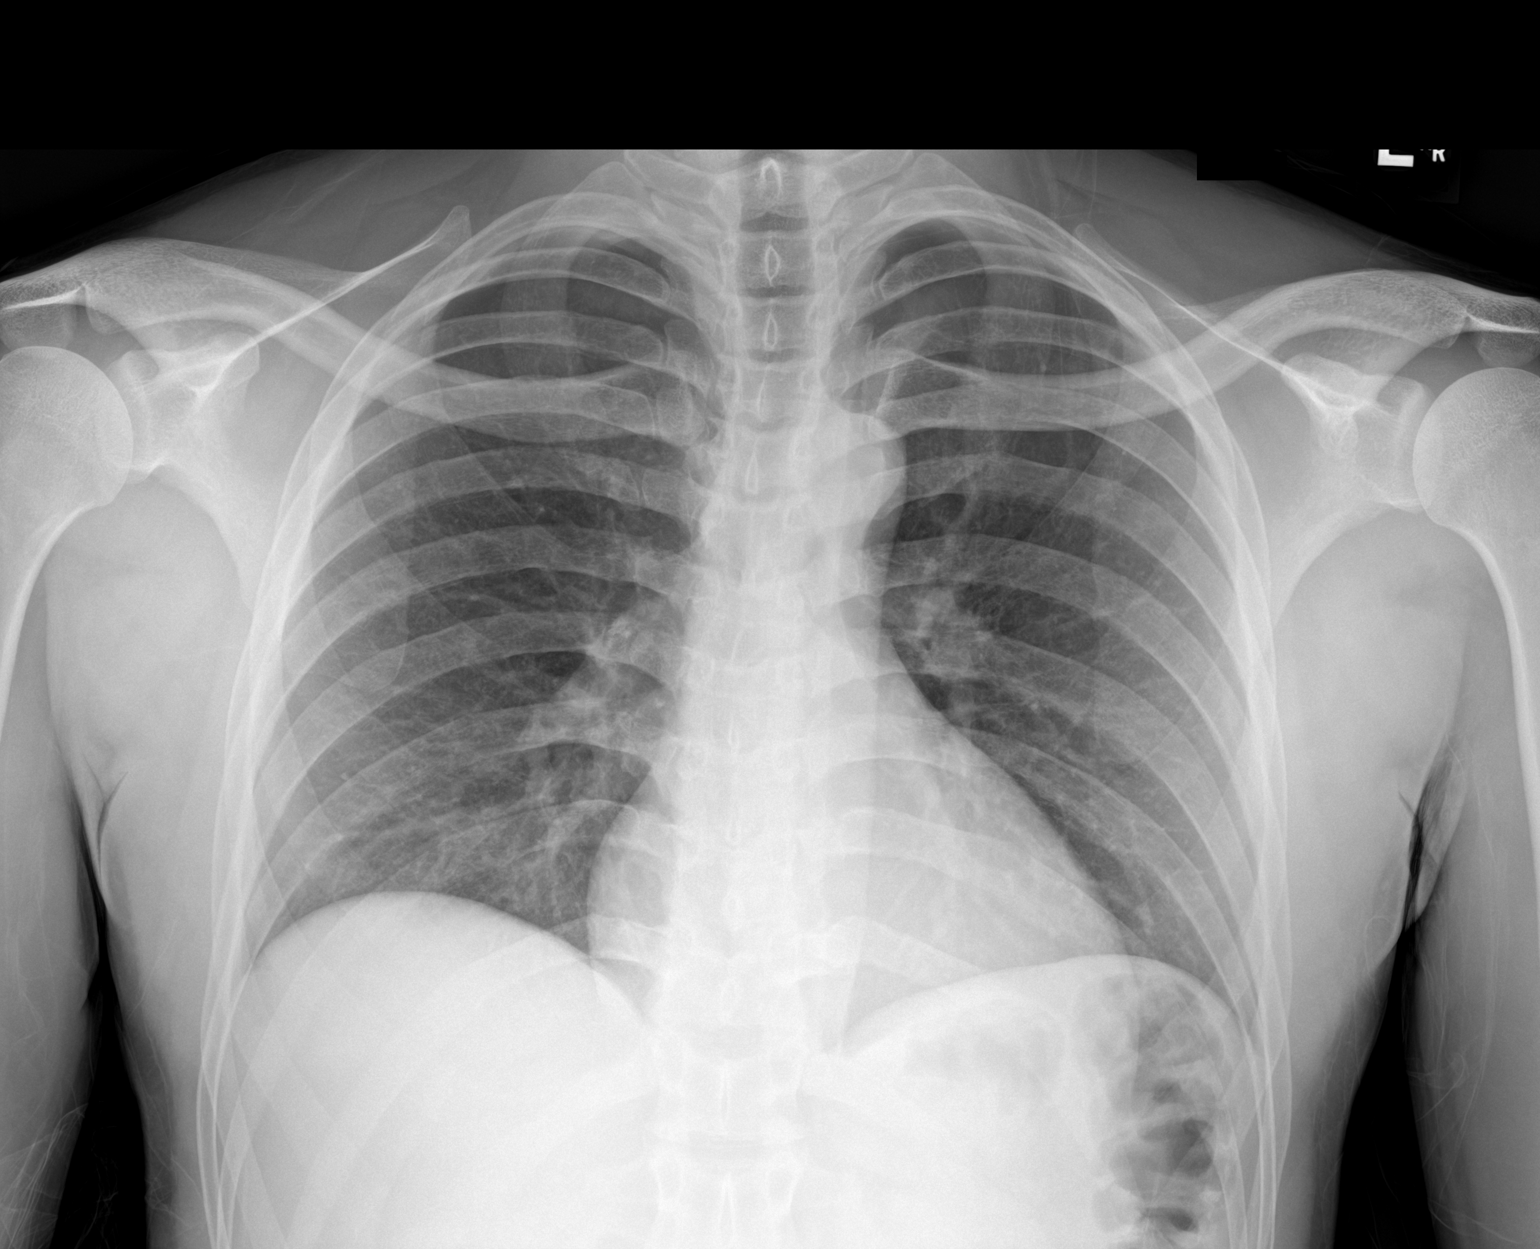

[1 of 1 positions shown; findings below may reference images not displayed]

FINDINGS: The heart size and mediastinal contours are within normal limits.
Both lungs are clear. The visualized skeletal structures are
unremarkable.
IMPRESSION: No active disease.

## 2020-08-06 ENCOUNTER — Ambulatory Visit: Payer: Self-pay | Admitting: Internal Medicine

## 2020-11-21 ENCOUNTER — Other Ambulatory Visit: Payer: Self-pay

## 2020-11-21 ENCOUNTER — Encounter: Payer: Self-pay | Admitting: Emergency Medicine

## 2020-11-21 ENCOUNTER — Emergency Department
Admission: EM | Admit: 2020-11-21 | Discharge: 2020-11-21 | Disposition: A | Discharge status: Home / Self Care | Payer: Self-pay | Attending: Emergency Medicine | Admitting: Emergency Medicine

## 2020-11-21 DIAGNOSIS — G43909 Migraine, unspecified, not intractable, without status migrainosus: Secondary | ICD-10-CM | POA: Insufficient documentation

## 2020-11-21 DIAGNOSIS — Z5321 Procedure and treatment not carried out due to patient leaving prior to being seen by health care provider: Secondary | ICD-10-CM | POA: Insufficient documentation

## 2020-11-21 NOTE — ED Notes (Signed)
FIRST NURSE NOTE:  Pt states he is leaving and will come back tomorrow. Advised him that the wait times may be just as bad tomorrow if not worse. Pt states "I can wait tomorrow"

## 2020-11-21 NOTE — ED Triage Notes (Signed)
Pt to ED via POV for migraines. Pt states that he has hx/o same. Pt states that he has been taken Excedrin and this seems to help but only for a short period of time. Pt states that he seen his MD at [facility] and they prescribed him sumatriptan but he was scared to take it because of the possible side effects. Pt is in NAD.

## 2023-01-02 DIAGNOSIS — R569 Unspecified convulsions: Secondary | ICD-10-CM | POA: Diagnosis not present

## 2023-01-02 DIAGNOSIS — G0490 Encephalitis and encephalomyelitis, unspecified: Secondary | ICD-10-CM | POA: Diagnosis not present

## 2023-01-02 DIAGNOSIS — C61 Malignant neoplasm of prostate: Secondary | ICD-10-CM | POA: Diagnosis not present

## 2023-01-03 DIAGNOSIS — G0490 Encephalitis and encephalomyelitis, unspecified: Secondary | ICD-10-CM | POA: Diagnosis not present

## 2023-01-03 DIAGNOSIS — R569 Unspecified convulsions: Secondary | ICD-10-CM | POA: Diagnosis not present

## 2023-03-15 DIAGNOSIS — G0490 Encephalitis and encephalomyelitis, unspecified: Secondary | ICD-10-CM | POA: Diagnosis not present

## 2023-06-05 DIAGNOSIS — M352 Behcet's disease: Secondary | ICD-10-CM | POA: Diagnosis not present

## 2023-06-19 ENCOUNTER — Emergency Department (HOSPITAL_COMMUNITY)
Admission: EM | Admit: 2023-06-19 | Discharge: 2023-06-19 | Disposition: A | Discharge status: Home / Self Care | Payer: Self-pay | Attending: Emergency Medicine | Admitting: Emergency Medicine

## 2023-06-19 DIAGNOSIS — R569 Unspecified convulsions: Secondary | ICD-10-CM | POA: Insufficient documentation

## 2023-06-19 LAB — CBC WITH DIFFERENTIAL/PLATELET
Abs Immature Granulocytes: 0.03 10*3/uL (ref 0.00–0.07)
Basophils Absolute: 0.1 10*3/uL (ref 0.0–0.1)
Basophils Relative: 1 %
Eosinophils Absolute: 0.4 10*3/uL (ref 0.0–0.5)
Eosinophils Relative: 4 %
HCT: 47 % (ref 39.0–52.0)
Hemoglobin: 15.3 g/dL (ref 13.0–17.0)
Immature Granulocytes: 0 %
Lymphocytes Relative: 34 %
Lymphs Abs: 3.4 10*3/uL (ref 0.7–4.0)
MCH: 29.3 pg (ref 26.0–34.0)
MCHC: 32.6 g/dL (ref 30.0–36.0)
MCV: 89.9 fL (ref 80.0–100.0)
Monocytes Absolute: 1.1 10*3/uL — ABNORMAL HIGH (ref 0.1–1.0)
Monocytes Relative: 11 %
Neutro Abs: 5 10*3/uL (ref 1.7–7.7)
Neutrophils Relative %: 50 %
Platelets: 353 10*3/uL (ref 150–400)
RBC: 5.23 MIL/uL (ref 4.22–5.81)
RDW: 14.2 % (ref 11.5–15.5)
WBC: 10 10*3/uL (ref 4.0–10.5)
nRBC: 0 % (ref 0.0–0.2)

## 2023-06-19 LAB — COMPREHENSIVE METABOLIC PANEL
ALT: 15 U/L (ref 0–44)
AST: 16 U/L (ref 15–41)
Albumin: 3.3 g/dL — ABNORMAL LOW (ref 3.5–5.0)
Alkaline Phosphatase: 65 U/L (ref 38–126)
Anion gap: 6 (ref 5–15)
BUN: 9 mg/dL (ref 6–20)
CO2: 21 mmol/L — ABNORMAL LOW (ref 22–32)
Calcium: 8.1 mg/dL — ABNORMAL LOW (ref 8.9–10.3)
Chloride: 106 mmol/L (ref 98–111)
Creatinine, Ser: 1.06 mg/dL (ref 0.61–1.24)
GFR, Estimated: >60 mL/min (ref >60)
Glucose, Bld: 89 mg/dL (ref 70–99)
Potassium: 3.3 mmol/L — ABNORMAL LOW (ref 3.5–5.1)
Sodium: 133 mmol/L — ABNORMAL LOW (ref 135–145)
Total Bilirubin: 0.8 mg/dL (ref 0.3–1.2)
Total Protein: 6.2 g/dL — ABNORMAL LOW (ref 6.5–8.1)

## 2023-06-19 LAB — MAGNESIUM: Magnesium: 1.8 mg/dL (ref 1.7–2.4)

## 2023-06-19 MED ORDER — LACOSAMIDE 100 MG PO TABS: 200.0000 mg | ORAL_TABLET | Freq: Two times a day (BID) | ORAL | 0 refills | Status: AC

## 2023-06-19 MED ORDER — SODIUM CHLORIDE 0.9 % IV SOLN
200.0000 mg | Freq: Once | INTRAVENOUS | Status: AC
  Administered 2023-06-19: 200 mg via INTRAVENOUS
  Filled 2023-06-19: qty 20

## 2023-06-19 MED ORDER — LORAZEPAM 2 MG/ML IJ SOLN
0.5000 mg | Freq: Once | INTRAMUSCULAR | Status: AC
  Administered 2023-06-19: 0.5 mg via INTRAVENOUS
  Filled 2023-06-19: qty 1

## 2023-06-19 NOTE — ED Triage Notes (Signed)
Pt to the ed from home with a CC of grand mal seizure this morning. Pt had 4 absent seizure yesterday. Pt has a hx of seizures starting in February. Pt is on meds and has been complainant. Pt is requiring additional o2 and is normally on room air pt is alert but lethargic currently.

## 2023-06-19 NOTE — ED Notes (Signed)
Help get patient on the monitor into a gown did EKG shown to er provider patient is resting with call bell in reach

## 2023-06-19 NOTE — ED Provider Notes (Signed)
  [place] [facility] AT [facility] Provider Note   CSN: [id number] Arrival date & time: 06/19/23  7829     History {Add pertinent medical, surgical, social history, OB history to HPI:1} Chief Complaint  Patient presents with   Seizures    Raymond Hendricks is a 32 y.o. male.  HPI      [name] was at [employer] he seemed, not out of it but like something going on. Started having blank stares, would say "ok ya ya" then would stare for about 2-3 seconds, did it 3-4 times yesterday.  Yesterday also seemed to have episode.   This morning, heard sound of him trying to say something, then was jerking, shaking, trying to talk, was laying on his back, trying to get up, was on a blow up mattress and rolled on stomach. When it first started was trying to call her name but then, laying on stmach and shaking all over, had incontinence, then was sleepy and confused. Lasted maybe 5-6 minutes.   No fall.     Home Medications Prior to Admission medications   Medication Sig Start Date End Date Taking? Authorizing Provider  albuterol (VENTOLIN HFA) 108 (90 Base) MCG/ACT inhaler Inhale 2 puffs into the lungs every 6 (six) hours as needed for wheezing or shortness of breath. 04/15/19   Phineas Semen, MD  ibuprofen (ADVIL) 600 MG tablet Take 1 tablet (600 mg total) by mouth every 6 (six) hours as needed for moderate pain. 08/20/19   Evon Slack, PA-C      Allergies    Patient has no known allergies.    Review of Systems   Review of Systems  Physical Exam Updated Vital Signs Ht 5\' 7"  (1.702 m)   Wt 72 kg   BMI 24.86 kg/m  Physical Exam  ED Results / Procedures / Treatments   Labs (all labs ordered are listed, but only abnormal results are displayed) Labs Reviewed  CBC WITH DIFFERENTIAL/PLATELET  COMPREHENSIVE METABOLIC PANEL  LACOSAMIDE  MAGNESIUM    EKG None  Radiology No results found.  Procedures Procedures  {Document cardiac  monitor, telemetry assessment procedure when appropriate:1}  Medications Ordered in ED Medications  LORazepam (ATIVAN) injection 0.5 mg (has no administration in time range)    ED Course/ Medical Decision Making/ A&P   {   Click here for ABCD2, HEART and other calculatorsREFRESH Note before signing :1}                              Medical Decision Making Amount and/or Complexity of Data Reviewed Labs: ordered.  Risk Prescription drug management.   ***  {Document critical care time when appropriate:1} {Document review of labs and clinical decision tools ie heart score, Chads2Vasc2 etc:1}  {Document your independent review of radiology images, and any outside records:1} {Document your discussion with family members, caretakers, and with consultants:1} {Document social determinants of health affecting pt's care:1} {Document your decision making why or why not admission, treatments were needed:1} Final Clinical Impression(s) / ED Diagnoses Final diagnoses:  None    Rx / DC Orders ED Discharge Orders     None

## 2023-06-25 DIAGNOSIS — M352 Behcet's disease: Secondary | ICD-10-CM | POA: Diagnosis not present

## 2023-06-25 DIAGNOSIS — G0490 Encephalitis and encephalomyelitis, unspecified: Secondary | ICD-10-CM | POA: Diagnosis not present

## 2023-07-19 DIAGNOSIS — R569 Unspecified convulsions: Secondary | ICD-10-CM | POA: Diagnosis not present

## 2023-07-19 DIAGNOSIS — M352 Behcet's disease: Secondary | ICD-10-CM | POA: Diagnosis not present

## 2023-07-19 DIAGNOSIS — G0490 Encephalitis and encephalomyelitis, unspecified: Secondary | ICD-10-CM | POA: Diagnosis not present

## 2023-09-14 DIAGNOSIS — G35 Multiple sclerosis: Secondary | ICD-10-CM | POA: Diagnosis not present

## 2023-09-28 DIAGNOSIS — G35 Multiple sclerosis: Secondary | ICD-10-CM | POA: Diagnosis not present

## 2024-02-04 ENCOUNTER — Other Ambulatory Visit: Payer: Self-pay

## 2024-02-04 ENCOUNTER — Emergency Department
Admission: EM | Admit: 2024-02-04 | Discharge: 2024-02-04 | Disposition: A | Discharge status: Home / Self Care | Attending: Emergency Medicine | Admitting: Emergency Medicine

## 2024-02-04 DIAGNOSIS — G40309 Generalized idiopathic epilepsy and epileptic syndromes, not intractable, without status epilepticus: Secondary | ICD-10-CM | POA: Insufficient documentation

## 2024-02-04 DIAGNOSIS — Z91148 Patient's other noncompliance with medication regimen for other reason: Secondary | ICD-10-CM | POA: Diagnosis not present

## 2024-02-04 DIAGNOSIS — R569 Unspecified convulsions: Secondary | ICD-10-CM

## 2024-02-04 LAB — CBC WITH DIFFERENTIAL/PLATELET
Abs Immature Granulocytes: 0.09 10*3/uL — ABNORMAL HIGH (ref 0.00–0.07)
Basophils Absolute: 0 10*3/uL (ref 0.0–0.1)
Basophils Relative: 0 %
Eosinophils Absolute: 0.1 10*3/uL (ref 0.0–0.5)
Eosinophils Relative: 1 %
HCT: 45.2 % (ref 39.0–52.0)
Hemoglobin: 14.9 g/dL (ref 13.0–17.0)
Immature Granulocytes: 1 %
Lymphocytes Relative: 21 %
Lymphs Abs: 2 10*3/uL (ref 0.7–4.0)
MCH: 29.2 pg (ref 26.0–34.0)
MCHC: 33 g/dL (ref 30.0–36.0)
MCV: 88.6 fL (ref 80.0–100.0)
Monocytes Absolute: 0.8 10*3/uL (ref 0.1–1.0)
Monocytes Relative: 8 %
Neutro Abs: 6.4 10*3/uL (ref 1.7–7.7)
Neutrophils Relative %: 69 %
Platelets: 305 10*3/uL (ref 150–400)
RBC: 5.1 MIL/uL (ref 4.22–5.81)
RDW: 13.5 % (ref 11.5–15.5)
WBC: 9.4 10*3/uL (ref 4.0–10.5)
nRBC: 0 % (ref 0.0–0.2)

## 2024-02-04 LAB — COMPREHENSIVE METABOLIC PANEL
ALT: 27 U/L (ref 0–44)
AST: 23 U/L (ref 15–41)
Albumin: 4 g/dL (ref 3.5–5.0)
Alkaline Phosphatase: 65 U/L (ref 38–126)
Anion gap: 13 (ref 5–15)
BUN: 12 mg/dL (ref 6–20)
CO2: 17 mmol/L — ABNORMAL LOW (ref 22–32)
Calcium: 9.1 mg/dL (ref 8.9–10.3)
Chloride: 106 mmol/L (ref 98–111)
Creatinine, Ser: 1.16 mg/dL (ref 0.61–1.24)
GFR, Estimated: >60 mL/min (ref >60)
Glucose, Bld: 95 mg/dL (ref 70–99)
Potassium: 4.4 mmol/L (ref 3.5–5.1)
Sodium: 136 mmol/L (ref 135–145)
Total Bilirubin: 0.7 mg/dL (ref 0.0–1.2)
Total Protein: 7 g/dL (ref 6.5–8.1)

## 2024-02-04 LAB — ETHANOL: Alcohol, Ethyl (B): <10 mg/dL (ref <10)

## 2024-02-04 MED ORDER — LACOSAMIDE 50 MG PO TABS
50.0000 mg | ORAL_TABLET | Freq: Once | ORAL | Status: AC
  Administered 2024-02-04: 50 mg via ORAL
  Filled 2024-02-04: qty 1

## 2024-02-04 MED ORDER — LACOSAMIDE 50 MG PO TABS
100.0000 mg | ORAL_TABLET | Freq: Once | ORAL | Status: AC
  Administered 2024-02-04: 100 mg via ORAL
  Filled 2024-02-04: qty 2

## 2024-02-04 MED ORDER — LACTATED RINGERS IV BOLUS
1000.0000 mL | Freq: Once | INTRAVENOUS | Status: AC
  Administered 2024-02-04: 1000 mL via INTRAVENOUS

## 2024-02-04 NOTE — ED Notes (Signed)
 Pt's mother at bedside.

## 2024-02-04 NOTE — Discharge Instructions (Addendum)
 Please remember to take your lacosamide twice a day as prescribed.

## 2024-02-04 NOTE — ED Provider Notes (Signed)
 [facility] Note    Event Date/Time   First MD Initiated Contact with Patient 02/04/24 1245     (approximate)   History   Seizures   HPI  Raymond Hendricks is a 33 y.o. male with history of CNS steroid responsive inflammatory disease, history of seizures, chronic headaches, presenting with seizure.  Witnessed by friend.  Per EMS is described as general tonic-clonic, lasted for 60 seconds.  Per EMS is described as general tonic-clonic, lasted for 60 seconds.  Was combative and postictal for fire department, when EMS got there, they said that he was calm, got little bit more agitated in the ambulance and was given 2.5 mg of Versed.  EMS states that patient denies being on any seizure medications.  Patient denies any drug use.  He denies any headaches or weakness.  States that he was feeling fine prior to the seizure.  He also states that he does not see any neurologist for his seizures.  On independent chart review, he is supposed to be on Vimpat 100 mg twice daily for seizures.  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     Physical Exam   Triage Vital Signs: ED Triage Vitals  Encounter Vitals Group     BP      Systolic BP Percentile      Diastolic BP Percentile      Pulse      Resp      Temp      Temp src      SpO2      Weight      Height      Head Circumference      Peak Flow      Pain Score      Pain Loc      Pain Education      Exclude from Growth Chart     Most recent vital signs: Vitals:   02/04/24 1251 02/04/24 1300  BP:    Pulse:  (!) 117  Resp:  (!) 26  Temp: 98.8 F (37.1 C)   SpO2:  95%     General: Slightly sleepy but follows commands,  no distress CV:  Good peripheral perfusion.  Resp:  Normal effort.  Abd:  No distention.  Other:  Pupils are equal and reactive, extraocular movements are intact, no cranial nerve deficits, no focal weakness or numbness.   ED Results / Procedures / Treatments   Labs (all labs ordered are listed, but only abnormal results are displayed) Labs Reviewed  COMPREHENSIVE METABOLIC PANEL - Abnormal; Notable for the following components:      Result Value   CO2 17 (*)    All other components within normal limits  CBC WITH DIFFERENTIAL/PLATELET - Abnormal; Notable for the following components:   Abs Immature Granulocytes 0.09 (*)    All other components within normal limits  ETHANOL     EKG  EKG shows sinus tachycardia, rate 129, normal QRS, normal QTc, no ischemic ST elevation, T wave flattening in 3, not significant compared to prior    PROCEDURES:  Critical Care performed: No  Procedures   MEDICATIONS ORDERED IN ED: Medications  lacosamide (VIMPAT) tablet 50 mg (  has no administration in time range)  lactated ringers bolus 1,000 mL (1,000 mLs Intravenous New Bag/Given 02/04/24 1313)  lacosamide (VIMPAT) tablet 100 mg (100 mg Oral Given 02/04/24 1311)     IMPRESSION / MDM / ASSESSMENT AND PLAN / ED COURSE  I reviewed the triage vital signs and the nursing notes.                              Differential diagnosis includes, but is not limited to, breakthrough seizures, medication noncompliance, patient denies any infectious symptoms.  He denies any trauma or falls.  Will get labs, will give him his morning dose of Vimpat.  Patient's presentation is most consistent with acute presentation with potential threat to life or bodily function.  Spoke to mom who is at bedside, she states that patient is supposed to be on 150 mg Vimpat twice daily, patient is sometimes noncompliant with medications, definitely missed a dose yesterday and this morning.  He is at his mental baseline at  this time, he denies any symptoms or infectious symptoms prior to episode happening.  When I was in the room, blood pressure was normal, heart rate was 110.  Independent review of labs, electrolyte severely deranged, alcohol levels not elevated, no leukocytosis.  Shared decision making done patient and mom and they are agreeable plan for discharge.  Considered but no indication for inpatient admission at this time, he is safe for outpatient management with his neurologist.  Instructed patient to take his lacosamide as prescribed.  Not to miss any doses.  Strict return precautions given.  Also instructed to follow-up with neurologist outpatient.  Discharge.      FINAL CLINICAL IMPRESSION(S) / ED DIAGNOSES   Final diagnoses:  Seizure (HCC)  History of medication noncompliance     Rx / DC Orders   ED Discharge Orders     None        Note:  This document was prepared using Dragon voice recognition software and may include unintentional dictation errors.    Claybon Jabs, MD 02/04/24 [phone]

## 2024-02-04 NOTE — ED Triage Notes (Signed)
 Pt to ED via [facility] for seizures. Pt had 1 min seizure with generalized movement. Pt has hx of same. Last seizure 1 year ago. Per EMS, pt was combative with fire dept while alert and oriented x4. Denies drug use and alcohol use.   EMS vitals prior to 2.5mg  versed Pulse 130 ST  BP 146/88 SPO2 98% RA CBG 95
